# Patient Record
Sex: Male | Born: 1990 | Race: Black or African American | Hispanic: Refuse to answer | Marital: Single | Smoking: Former smoker
Health system: Southern US, Community
[De-identification: ages and names within clinical notes are randomized; demographics above are authoritative.]

---
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
## 2014-06-22 ENCOUNTER — Encounter (HOSPITAL_COMMUNITY): Payer: Self-pay | Admitting: *Deleted

## 2014-06-22 ENCOUNTER — Emergency Department (HOSPITAL_COMMUNITY)
Admission: EM | Admit: 2014-06-22 | Discharge: 2014-06-22 | Disposition: A | Discharge status: Home / Self Care | Payer: Self-pay | Attending: Emergency Medicine | Admitting: Emergency Medicine

## 2014-06-22 DIAGNOSIS — Y998 Other external cause status: Secondary | ICD-10-CM | POA: Insufficient documentation

## 2014-06-22 DIAGNOSIS — W01198A Fall on same level from slipping, tripping and stumbling with subsequent striking against other object, initial encounter: Secondary | ICD-10-CM | POA: Insufficient documentation

## 2014-06-22 DIAGNOSIS — Y9367 Activity, basketball: Secondary | ICD-10-CM | POA: Insufficient documentation

## 2014-06-22 DIAGNOSIS — Y92310 Basketball court as the place of occurrence of the external cause: Secondary | ICD-10-CM | POA: Insufficient documentation

## 2014-06-22 DIAGNOSIS — Z87891 Personal history of nicotine dependence: Secondary | ICD-10-CM | POA: Insufficient documentation

## 2014-06-22 DIAGNOSIS — S0181XA Laceration without foreign body of other part of head, initial encounter: Secondary | ICD-10-CM | POA: Insufficient documentation

## 2014-06-22 MED ORDER — LIDOCAINE HCL (PF) 1 % IJ SOLN
INTRAMUSCULAR | Status: AC
  Administered 2014-06-22: 19:00:00
  Filled 2014-06-22: qty 5
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
## 2014-06-22 NOTE — Discharge Instructions (Signed)
Apply ice to the injured area. Take ibuprofen for pain. Return in 5 days for suture removal or sooner for problems.

## 2014-06-22 NOTE — ED Notes (Signed)
Pt states he tripped on cement and hit face at right cheek, denies LOC

## 2018-11-09 ENCOUNTER — Other Ambulatory Visit: Payer: Self-pay

## 2018-11-09 DIAGNOSIS — Z20822 Contact with and (suspected) exposure to covid-19: Secondary | ICD-10-CM

## 2018-11-11 LAB — NOVEL CORONAVIRUS, NAA: SARS-CoV-2, NAA: NOT DETECTED

## 2019-06-18 ENCOUNTER — Ambulatory Visit: Payer: Self-pay | Attending: Internal Medicine

## 2019-06-18 DIAGNOSIS — Z23 Encounter for immunization: Secondary | ICD-10-CM

## 2019-06-18 NOTE — Progress Notes (Signed)
   Covid-19 Vaccination Clinic  Name:  Ronald Estrada    MRN: [id number] DOB: 08-10-1990  06/18/2019  [name] was observed post Covid-19 immunization for 15 minutes without incident. He was provided with Vaccine Information Sheet and instruction to access the V-Safe system.   Mr. Route was instructed to call 911 with any severe reactions post vaccine: Marland Kitchen Difficulty breathing  . Swelling of face and throat  . A fast heartbeat  . A bad rash all over body  . Dizziness and weakness   Immunizations Administered    Name Date Dose VIS Date Route   Pfizer COVID-19 Vaccine 06/18/2019  1:36 PM 0.3 mL 02/23/2019 Intramuscular   Manufacturer: [employer], [employer]   Lot: [id number]   NDC: [address]-9

## 2019-06-27 ENCOUNTER — Emergency Department (HOSPITAL_COMMUNITY)
Admission: EM | Admit: 2019-06-27 | Discharge: 2019-06-28 | Disposition: A | Discharge status: Home / Self Care | Payer: Self-pay | Attending: Emergency Medicine | Admitting: Emergency Medicine

## 2019-06-27 ENCOUNTER — Encounter (HOSPITAL_COMMUNITY): Payer: Self-pay

## 2019-06-27 DIAGNOSIS — R05 Cough: Secondary | ICD-10-CM | POA: Insufficient documentation

## 2019-06-27 DIAGNOSIS — Z20822 Contact with and (suspected) exposure to covid-19: Secondary | ICD-10-CM | POA: Insufficient documentation

## 2019-06-27 DIAGNOSIS — Z5321 Procedure and treatment not carried out due to patient leaving prior to being seen by health care provider: Secondary | ICD-10-CM | POA: Insufficient documentation

## 2019-06-27 DIAGNOSIS — M7918 Myalgia, other site: Secondary | ICD-10-CM | POA: Insufficient documentation

## 2019-06-27 DIAGNOSIS — R070 Pain in throat: Secondary | ICD-10-CM | POA: Insufficient documentation

## 2019-06-27 NOTE — ED Triage Notes (Signed)
Pt reports that he has been having cough, body aches, fatigue, sore throat, reports first vaccine on 4/5

## 2019-06-28 LAB — POC SARS CORONAVIRUS 2 AG -  ED: SARS Coronavirus 2 Ag: NEGATIVE

## 2019-06-28 NOTE — ED Notes (Signed)
Patient left the ER.  

## 2019-07-09 ENCOUNTER — Ambulatory Visit: Payer: Self-pay

## 2019-07-09 ENCOUNTER — Ambulatory Visit: Payer: Self-pay | Attending: Internal Medicine

## 2019-07-09 DIAGNOSIS — Z23 Encounter for immunization: Secondary | ICD-10-CM

## 2019-07-09 NOTE — Progress Notes (Signed)
   Covid-19 Vaccination Clinic  Name:  Ronald Estrada    MRN: [id number] DOB: [birth year]  07/09/2019  Mr. Messina was observed post Covid-19 immunization for 15 minutes without incident. He was provided with Vaccine Information Sheet and instruction to access the V-Safe system.   Mr. Schanz was instructed to call 911 with any severe reactions post vaccine: Marland Kitchen Difficulty breathing  . Swelling of face and throat  . A fast heartbeat  . A bad rash all over body  . Dizziness and weakness   Immunizations Administered    Name Date Dose VIS Date Route   Pfizer COVID-19 Vaccine 07/09/2019 12:58 PM 0.3 mL 05/09/2018 Intramuscular   Manufacturer: [employer], [employer]   Lot: [id number]   NDC: [address]-7

## 2019-08-15 ENCOUNTER — Emergency Department
Admission: EM | Admit: 2019-08-15 | Discharge: 2019-08-15 | Disposition: A | Discharge status: Home / Self Care | Payer: Self-pay | Attending: Student in an Organized Health Care Education/Training Program | Admitting: Student in an Organized Health Care Education/Training Program

## 2019-08-15 ENCOUNTER — Encounter: Payer: Self-pay | Admitting: Emergency Medicine

## 2019-08-15 ENCOUNTER — Emergency Department: Payer: Self-pay

## 2019-08-15 ENCOUNTER — Other Ambulatory Visit: Payer: Self-pay

## 2019-08-15 DIAGNOSIS — S60222A Contusion of left hand, initial encounter: Secondary | ICD-10-CM

## 2019-08-15 DIAGNOSIS — Y92410 Unspecified street and highway as the place of occurrence of the external cause: Secondary | ICD-10-CM | POA: Insufficient documentation

## 2019-08-15 DIAGNOSIS — Y9389 Activity, other specified: Secondary | ICD-10-CM | POA: Insufficient documentation

## 2019-08-15 DIAGNOSIS — Z87891 Personal history of nicotine dependence: Secondary | ICD-10-CM | POA: Insufficient documentation

## 2019-08-15 DIAGNOSIS — Y999 Unspecified external cause status: Secondary | ICD-10-CM | POA: Insufficient documentation

## 2019-08-15 MED ORDER — NAPROXEN 500 MG PO TABS
500.0000 mg | ORAL_TABLET | Freq: Once | ORAL | Status: AC
  Administered 2019-08-15: 500 mg via ORAL
  Filled 2019-08-15: qty 1

## 2019-08-15 MED ORDER — IBUPROFEN 600 MG PO TABS: 600.0000 mg | ORAL_TABLET | Freq: Three times a day (TID) | ORAL | 0 refills | Status: AC | PRN
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
## 2019-08-15 NOTE — Discharge Instructions (Signed)
Follow discharge care instruction and wear Ace wrap for 2 days
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
